# Patient Record
Sex: Male | Born: 1955 | Race: Black or African American | Hispanic: No | Marital: Married | Smoking: Current every day smoker
Health system: Southern US, Community
[De-identification: ages and names within clinical notes are randomized; demographics above are authoritative.]

## PROBLEM LIST (undated history)

## (undated) DIAGNOSIS — E785 Hyperlipidemia, unspecified: Secondary | ICD-10-CM

## (undated) DIAGNOSIS — I10 Essential (primary) hypertension: Secondary | ICD-10-CM

## (undated) DIAGNOSIS — IMO0001 Reserved for inherently not codable concepts without codable children: Secondary | ICD-10-CM

## (undated) DIAGNOSIS — M1990 Unspecified osteoarthritis, unspecified site: Secondary | ICD-10-CM

## (undated) HISTORY — PX: NO PAST SURGERIES: SHX2092

---

## 2015-11-24 ENCOUNTER — Other Ambulatory Visit: Payer: Self-pay | Admitting: Orthopaedic Surgery

## 2015-12-04 ENCOUNTER — Ambulatory Visit (HOSPITAL_COMMUNITY)
Admission: RE | Admit: 2015-12-04 | Discharge: 2015-12-04 | Disposition: A | Discharge status: Home / Self Care | Payer: Medicare Other | Source: Ambulatory Visit | Attending: Orthopaedic Surgery | Admitting: Orthopaedic Surgery

## 2015-12-04 ENCOUNTER — Encounter (HOSPITAL_COMMUNITY): Payer: Self-pay

## 2015-12-04 ENCOUNTER — Encounter (HOSPITAL_COMMUNITY)
Admission: RE | Admit: 2015-12-04 | Discharge: 2015-12-04 | Disposition: A | Discharge status: Home / Self Care | Payer: Medicare Other | Source: Ambulatory Visit | Attending: Orthopaedic Surgery | Admitting: Orthopaedic Surgery

## 2015-12-04 DIAGNOSIS — I10 Essential (primary) hypertension: Secondary | ICD-10-CM | POA: Diagnosis not present

## 2015-12-04 DIAGNOSIS — E785 Hyperlipidemia, unspecified: Secondary | ICD-10-CM | POA: Insufficient documentation

## 2015-12-04 DIAGNOSIS — R9431 Abnormal electrocardiogram [ECG] [EKG]: Secondary | ICD-10-CM | POA: Insufficient documentation

## 2015-12-04 DIAGNOSIS — Z01818 Encounter for other preprocedural examination: Secondary | ICD-10-CM

## 2015-12-04 DIAGNOSIS — Z79899 Other long term (current) drug therapy: Secondary | ICD-10-CM | POA: Diagnosis not present

## 2015-12-04 DIAGNOSIS — F17200 Nicotine dependence, unspecified, uncomplicated: Secondary | ICD-10-CM | POA: Insufficient documentation

## 2015-12-04 DIAGNOSIS — Z01812 Encounter for preprocedural laboratory examination: Secondary | ICD-10-CM | POA: Diagnosis not present

## 2015-12-04 DIAGNOSIS — M1612 Unilateral primary osteoarthritis, left hip: Secondary | ICD-10-CM | POA: Diagnosis not present

## 2015-12-04 DIAGNOSIS — Z0183 Encounter for blood typing: Secondary | ICD-10-CM | POA: Diagnosis not present

## 2015-12-04 HISTORY — DX: Reserved for inherently not codable concepts without codable children: IMO0001

## 2015-12-04 HISTORY — DX: Unspecified osteoarthritis, unspecified site: M19.90

## 2015-12-04 HISTORY — DX: Essential (primary) hypertension: I10

## 2015-12-04 HISTORY — DX: Hyperlipidemia, unspecified: E78.5

## 2015-12-04 LAB — CBC WITH DIFFERENTIAL/PLATELET
BASOS PCT: 1 %
Basophils Absolute: 0.1 10*3/uL (ref 0.0–0.1)
EOS ABS: 0.6 10*3/uL (ref 0.0–0.7)
Eosinophils Relative: 4 %
HEMATOCRIT: 46 % (ref 39.0–52.0)
HEMOGLOBIN: 14.6 g/dL (ref 13.0–17.0)
LYMPHS ABS: 4.9 10*3/uL — AB (ref 0.7–4.0)
LYMPHS PCT: 33 %
MCH: 27.8 pg (ref 26.0–34.0)
MCHC: 31.7 g/dL (ref 30.0–36.0)
MCV: 87.5 fL (ref 78.0–100.0)
MONOS PCT: 5 %
Monocytes Absolute: 0.7 10*3/uL (ref 0.1–1.0)
NEUTROS ABS: 8.4 10*3/uL — AB (ref 1.7–7.7)
Neutrophils Relative %: 57 %
Platelets: 321 10*3/uL (ref 150–400)
RBC: 5.26 MIL/uL (ref 4.22–5.81)
RDW: 14.4 % (ref 11.5–15.5)
WBC: 14.7 10*3/uL — ABNORMAL HIGH (ref 4.0–10.5)

## 2015-12-04 LAB — PROTIME-INR
INR: 1.05 (ref 0.00–1.49)
Prothrombin Time: 13.9 seconds (ref 11.6–15.2)

## 2015-12-04 LAB — ABO/RH: ABO/RH(D): O POS

## 2015-12-04 LAB — BASIC METABOLIC PANEL
ANION GAP: 6 (ref 5–15)
BUN: 17 mg/dL (ref 6–20)
CALCIUM: 9.2 mg/dL (ref 8.9–10.3)
CO2: 24 mmol/L (ref 22–32)
Chloride: 109 mmol/L (ref 101–111)
Creatinine, Ser: 1 mg/dL (ref 0.61–1.24)
GFR calc Af Amer: >60 mL/min (ref >60)
GFR calc non Af Amer: >60 mL/min (ref >60)
GLUCOSE: 96 mg/dL (ref 65–99)
Potassium: 3.9 mmol/L (ref 3.5–5.1)
Sodium: 139 mmol/L (ref 135–145)

## 2015-12-04 LAB — TYPE AND SCREEN
ABO/RH(D): O POS
Antibody Screen: NEGATIVE

## 2015-12-04 LAB — URINALYSIS, ROUTINE W REFLEX MICROSCOPIC
Bilirubin Urine: NEGATIVE
Glucose, UA: NEGATIVE mg/dL
Hgb urine dipstick: NEGATIVE
KETONES UR: NEGATIVE mg/dL
LEUKOCYTES UA: NEGATIVE
NITRITE: NEGATIVE
PROTEIN: NEGATIVE mg/dL
Specific Gravity, Urine: 1.031 — ABNORMAL HIGH (ref 1.005–1.030)
pH: 5.5 (ref 5.0–8.0)

## 2015-12-04 LAB — SURGICAL PCR SCREEN
MRSA, PCR: NEGATIVE
STAPHYLOCOCCUS AUREUS: NEGATIVE

## 2015-12-04 LAB — APTT: aPTT: 35 seconds (ref 24–37)

## 2015-12-04 NOTE — Pre-Procedure Instructions (Signed)
    Randall Dudley  12/04/2015    Your procedure is scheduled on Tuesday, June 13.  Report [facility] at 11:20 A.M.                   Your surgery or procedure is scheduled for 1:20 PM   Call this number if you have problems the morning of surgery: [phone]                 For any other questions, please call [phone], Monday - Friday 8 AM - 4 PM.   Remember:  Do not eat food or drink liquids after midnight Monday, June 12.  Take these medicines the morning of surgery with A SIP OF WATER: lovastatin (MEVACOR),  Metoprolol (Lopressor).               Monday, June 5, STOP taking Ibuprofen (Advil, Motrin).  DO NOT take Naproxen (Aleve), Vitamins, Herbal Products. Aspirin, Aspirin Products.    Do not wear jewelry, make-up or nail polish.  Do not wear lotions, powders, or perfumes.    Men may shave face and neck.  Do not bring valuables to the hospital.  [facility] is not responsible for any belongings or valuables.  Contacts, dentures or bridgework may not be worn into surgery.  Leave your suitcase in the car.  After surgery it may be brought to your room.  For patients admitted to the hospital, discharge time will be determined by your treatment team.  Special instructions:  Review  [place] - Preparing For Surgery.  Please read over the following fact sheets that you were given. [place]- Preparing For Surgery and Patient Instructions for Mupirocin Application, Incentive Spirometry.

## 2015-12-04 NOTE — Progress Notes (Addendum)
Randall Dudley' PCP is Dr Hassie BruceSam Hassan in [place], [place].  Patient does not have a cardiologist.  Randall Dudley denies chest pain, only have shortness of breath with exertion. Randall Dudley reports that he had a stress test many years  "6110, may be 20 years ago."  Patient is unsure of why he had it do. I requested records from Dr Ginette OttoHassan's offive

## 2015-12-04 NOTE — Progress Notes (Signed)
   12/04/15 1031  OBSTRUCTIVE SLEEP APNEA  Have you ever been diagnosed with sleep apnea through a sleep study? No  Do you snore loudly (loud enough to be heard through closed doors)?  0  Do you often feel tired, fatigued, or sleepy during the daytime (such as falling asleep during driving or talking to someone)? 0  Has anyone observed you stop breathing during your sleep? 0  Do you have, or are you being treated for high blood pressure? 1  BMI more than 35 kg/m2? 1  Age > 50 (1-yes) 1  Neck circumference greater than:Male 16 inches or larger, Male 17inches or larger? 1 53(18)  Male Gender (Yes=1) 1  Obstructive Sleep Apnea Score 5  Score 5 or greater  Results sent to PCP

## 2015-12-07 ENCOUNTER — Encounter (HOSPITAL_COMMUNITY): Payer: Self-pay | Admitting: Emergency Medicine

## 2015-12-07 NOTE — Progress Notes (Signed)
Anesthesia Chart Review:  Pt is a 60 year old male scheduled for L total hip arthroplasty on 12/15/2015 with Dr. Jerl Santosalldorf.   PCP is Dr. Quitman LivingsSami Hassan.   PMH includes:  HTN, hyperlipidemia. Current smoker. BMI 35.5  Medications include: lovastatin, metoprolol  Preoperative labs reviewed.    Chest x-ray 12/04/15 reviewed. No active cardiopulmonary disease  EKG 12/04/15: NSR. Incomplete RBBB. LVH. Nonspecific T wave abnormality. Prolonged QT (QTC 467)  If no changes, I anticipate pt can proceed with surgery as scheduled.   Rica Mastngela Khushboo Chuck, FNP-BC [facility]/Anesthesiology Phone: [phone] 12/07/2015 2:50 PM

## 2015-12-15 ENCOUNTER — Inpatient Hospital Stay (HOSPITAL_COMMUNITY): Admission: RE | Admit: 2015-12-15 | Payer: Medicare Other | Source: Ambulatory Visit | Admitting: Orthopaedic Surgery

## 2015-12-15 ENCOUNTER — Encounter (HOSPITAL_COMMUNITY): Admission: RE | Payer: Self-pay | Source: Ambulatory Visit

## 2015-12-15 SURGERY — ARTHROPLASTY, HIP, TOTAL, ANTERIOR APPROACH
Anesthesia: Spinal | Laterality: Left

## 2015-12-24 DIAGNOSIS — D72829 Elevated white blood cell count, unspecified: Secondary | ICD-10-CM | POA: Diagnosis not present

## 2016-02-11 ENCOUNTER — Other Ambulatory Visit: Payer: Self-pay | Admitting: Orthopaedic Surgery

## 2016-03-11 ENCOUNTER — Encounter (HOSPITAL_COMMUNITY): Payer: Self-pay

## 2016-03-11 ENCOUNTER — Encounter (HOSPITAL_COMMUNITY)
Admission: RE | Admit: 2016-03-11 | Discharge: 2016-03-11 | Disposition: A | Discharge status: Home / Self Care | Payer: Medicare Other | Source: Ambulatory Visit | Attending: Orthopaedic Surgery | Admitting: Orthopaedic Surgery

## 2016-03-11 DIAGNOSIS — F17200 Nicotine dependence, unspecified, uncomplicated: Secondary | ICD-10-CM | POA: Insufficient documentation

## 2016-03-11 DIAGNOSIS — I10 Essential (primary) hypertension: Secondary | ICD-10-CM | POA: Diagnosis not present

## 2016-03-11 DIAGNOSIS — D72829 Elevated white blood cell count, unspecified: Secondary | ICD-10-CM | POA: Diagnosis not present

## 2016-03-11 DIAGNOSIS — Z01812 Encounter for preprocedural laboratory examination: Secondary | ICD-10-CM | POA: Insufficient documentation

## 2016-03-11 DIAGNOSIS — E785 Hyperlipidemia, unspecified: Secondary | ICD-10-CM | POA: Insufficient documentation

## 2016-03-11 DIAGNOSIS — Z79899 Other long term (current) drug therapy: Secondary | ICD-10-CM | POA: Insufficient documentation

## 2016-03-11 LAB — URINALYSIS, ROUTINE W REFLEX MICROSCOPIC
Bilirubin Urine: NEGATIVE
Glucose, UA: NEGATIVE mg/dL
Hgb urine dipstick: NEGATIVE
KETONES UR: NEGATIVE mg/dL
LEUKOCYTES UA: NEGATIVE
NITRITE: NEGATIVE
PH: 5.5 (ref 5.0–8.0)
Protein, ur: NEGATIVE mg/dL
SPECIFIC GRAVITY, URINE: 1.029 (ref 1.005–1.030)

## 2016-03-11 LAB — CBC WITH DIFFERENTIAL/PLATELET
Basophils Absolute: 0.1 10*3/uL (ref 0.0–0.1)
Basophils Relative: 1 %
EOS ABS: 0.6 10*3/uL (ref 0.0–0.7)
EOS PCT: 4 %
HCT: 45.3 % (ref 39.0–52.0)
Hemoglobin: 14.5 g/dL (ref 13.0–17.0)
LYMPHS ABS: 5.4 10*3/uL — AB (ref 0.7–4.0)
Lymphocytes Relative: 34 %
MCH: 28.5 pg (ref 26.0–34.0)
MCHC: 32 g/dL (ref 30.0–36.0)
MCV: 89 fL (ref 78.0–100.0)
MONOS PCT: 8 %
Monocytes Absolute: 1.3 10*3/uL — ABNORMAL HIGH (ref 0.1–1.0)
Neutro Abs: 8.5 10*3/uL — ABNORMAL HIGH (ref 1.7–7.7)
Neutrophils Relative %: 53 %
PLATELETS: 316 10*3/uL (ref 150–400)
RBC: 5.09 MIL/uL (ref 4.22–5.81)
RDW: 14.2 % (ref 11.5–15.5)
WBC: 15.9 10*3/uL — ABNORMAL HIGH (ref 4.0–10.5)

## 2016-03-11 LAB — BASIC METABOLIC PANEL
Anion gap: 7 (ref 5–15)
BUN: 13 mg/dL (ref 6–20)
CHLORIDE: 111 mmol/L (ref 101–111)
CO2: 24 mmol/L (ref 22–32)
CREATININE: 0.98 mg/dL (ref 0.61–1.24)
Calcium: 9.6 mg/dL (ref 8.9–10.3)
GFR calc Af Amer: >60 mL/min (ref >60)
GFR calc non Af Amer: >60 mL/min (ref >60)
Glucose, Bld: 92 mg/dL (ref 65–99)
Potassium: 3.8 mmol/L (ref 3.5–5.1)
SODIUM: 142 mmol/L (ref 135–145)

## 2016-03-11 LAB — PROTIME-INR
INR: 0.99
PROTHROMBIN TIME: 13.1 s (ref 11.4–15.2)

## 2016-03-11 LAB — APTT: aPTT: 35 seconds (ref 24–36)

## 2016-03-11 LAB — SURGICAL PCR SCREEN
MRSA, PCR: NEGATIVE
STAPHYLOCOCCUS AUREUS: NEGATIVE

## 2016-03-11 NOTE — Progress Notes (Signed)
PCP - Dr. Quitman LivingsSami Hassan Cardiologist - denies  EKG - 12/04/15 CXR - denies  Echo/cardiac cath - denies Stress test - greater than 10 years ago  Patient denies chest pain and shortness of breath at PAT appointment.  Patient states that his surgery was previously canceled due to elevated blood work and that he was sent to [facility] for additional labs that were normal.  Requested labs and last office visit from PCP (Dr. Roseanne RenoHassan) and [facility].

## 2016-03-11 NOTE — Pre-Procedure Instructions (Signed)
Randall Dudley  03/11/2016      [facility] - [place], [place] - [address] [place] [address] Phone: [phone] Fax: [phone]    Your procedure is scheduled on Tuesday, September 19th, 2017.  [facility] to [facility] Admitting at 5:30 A.M.   Call this number if you have problems the morning of surgery:  [phone]   Remember:  Do not eat food or drink liquids after midnight.   Take these medicines the morning of surgery with A SIP OF WATER: Metoprolol Tartrate (Lopressor), Oxycodone-acetaminophen (Percocet) if needed.  7 days prior to surgery, stop taking: Ibuprofen, Advil, Motrin, Aspirin, NSAIDS, Aleve, Naproxen, BC's, Goody's, Fish oil, all herbal medications, and all vitamins.    Do not wear jewelry.  Do not wear lotions, powders, or colognes, or deoderant.  Men may shave face and neck.  Do not bring valuables to the hospital.  [facility] is not responsible for any belongings or valuables.  Contacts, dentures or bridgework may not be worn into surgery.  Leave your suitcase in the car.  After surgery it may be brought to your room.  For patients admitted to the hospital, discharge time will be determined by your treatment team.  Patients discharged the day of surgery will not be allowed to drive home.   Special instructions:  Preparing for Surgery.   Please read over the following fact sheets that you were given. MRSA Information    [place]- Preparing For Surgery  Before surgery, you can play an important role. Because skin is not sterile, your skin needs to be as free of germs as possible. You can reduce the number of germs on your skin by washing with CHG (chlorahexidine gluconate) Soap before surgery.  CHG is an antiseptic cleaner which kills germs and bonds with the skin to continue killing germs even after washing.  Please do not use if you have an allergy to CHG or antibacterial soaps. If your  skin becomes reddened/irritated stop using the CHG.  Do not shave (including legs and underarms) for at least 48 hours prior to first CHG shower. It is OK to shave your face.  Please follow these instructions carefully.   1. Shower the NIGHT BEFORE SURGERY and the MORNING OF SURGERY with CHG.   2. If you chose to wash your hair, wash your hair first as usual with your normal shampoo.  3. After you shampoo, rinse your hair and body thoroughly to remove the shampoo.  4. Use CHG as you would any other liquid soap. You can apply CHG directly to the skin and wash gently with a scrungie or a clean washcloth.   5. Apply the CHG Soap to your body ONLY FROM THE NECK DOWN.  Do not use on open wounds or open sores. Avoid contact with your eyes, ears, mouth and genitals (private parts). Wash genitals (private parts) with your normal soap.  6. Wash thoroughly, paying special attention to the area where your surgery will be performed.  7. Thoroughly rinse your body with warm water from the neck down.  8. DO NOT shower/wash with your normal soap after using and rinsing off the CHG Soap.  9. Pat yourself dry with a CLEAN TOWEL.   10. Wear CLEAN PAJAMAS   11. Place CLEAN SHEETS on your bed the night of your first shower and DO NOT SLEEP WITH PETS.  Day of Surgery: Do not apply any deodorants/lotions. Please wear  clean clothes to the hospital/surgery center.

## 2016-03-14 NOTE — H&P (Signed)
TOTAL HIP ADMISSION H&P  Patient is admitted for left total hip arthroplasty.  Subjective:  Chief Complaint: left hip pain  HPI: Randall Dudley, 60 y.o. male, has a history of pain and functional disability in the left hip(s) due to arthritis and patient has failed non-surgical conservative treatments for greater than 12 weeks to include NSAID's and/or analgesics, corticosteriod injections, flexibility and strengthening excercises, use of assistive devices, weight reduction as appropriate and activity modification.  Onset of symptoms was gradual starting 5 years ago with gradually worsening course since that time.The patient noted no past surgery on the left hip(s).  Patient currently rates pain in the left hip at 10 out of 10 with activity. Patient has night pain, worsening of pain with activity and weight bearing, trendelenberg gait, pain that interfers with activities of daily living and crepitus. Patient has evidence of subchondral cysts, subchondral sclerosis, periarticular osteophytes and joint space narrowing by imaging studies. This condition presents safety issues increasing the risk of falls. There is no current active infection.  There are no active problems to display for this patient.  Past Medical History:  Diagnosis Date  . Arthritis   . DJD (degenerative joint disease)   . Hyperlipemia   . Hypertension   . Shortness of breath dyspnea    with exertion    Past Surgical History:  Procedure Laterality Date  . NO PAST SURGERIES      No prescriptions prior to admission.   No Known Allergies  Social History  Substance Use Topics  . Smoking status: Current Every Day Smoker    Packs/day: 1.00    Years: 40.00  . Smokeless tobacco: Never Used  . Alcohol use No    No family history on file.   Review of Systems  Musculoskeletal: Positive for joint pain.       Left hip  All other systems reviewed and are negative.   Objective:  Physical Exam  Constitutional: He is  oriented to person, place, and time. He appears well-developed and well-nourished.  HENT:  Head: Normocephalic and atraumatic.  Eyes: Pupils are equal, round, and reactive to light.  Neck: Normal range of motion.  Cardiovascular: Normal rate and regular rhythm.   Respiratory: Effort normal.  GI: Soft.  Musculoskeletal:  Left hip motion is very limited and extremely painful to internal rotation. Opposite hip moves somewhat better. Straight leg raise causes hip and back pain on the left and is negative on the right. He has good lumbosacral motion and no scars on his low back. Abdominal exam is benign except for moderate obesity. Sensation and motor function are intact in his feet with palpable pulses on both sides.   Neurological: He is alert and oriented to person, place, and time.  Skin: Skin is warm and dry.  Psychiatric: He has a normal mood and affect. His behavior is normal. Judgment and thought content normal.    Vital signs in last 24 hours:    Labs:   Estimated body mass index is 33.97 kg/m as calculated from the following:   Height as of 03/11/16: 5\' 9"  (1.753 m).   Weight as of 03/11/16: 104.3 kg (230 lb).   Imaging Review Plain radiographs demonstrate severe degenerative joint disease of the left hip(s). The bone quality appears to be good for age and reported activity level.  Assessment/Plan:  End stage primary arthritis, left hip(s)  The patient history, physical examination, clinical judgement of the provider and imaging studies are consistent with end stage degenerative  joint disease of the left hip(s) and total hip arthroplasty is deemed medically necessary. The treatment options including medical management, injection therapy, arthroscopy and arthroplasty were discussed at length. The risks and benefits of total hip arthroplasty were presented and reviewed. The risks due to aseptic loosening, infection, stiffness, dislocation/subluxation,  thromboembolic complications  and other imponderables were discussed.  The patient acknowledged the explanation, agreed to proceed with the plan and consent was signed. Patient is being admitted for inpatient treatment for surgery, pain control, PT, OT, prophylactic antibiotics, VTE prophylaxis, progressive ambulation and ADL's and discharge planning.The patient is planning to be discharged home with home health services

## 2016-03-15 NOTE — Progress Notes (Signed)
Anesthesia Chart Review:  Pt is a 60 year old male scheduled for L total hip arthroplasty anterior approach on 03/22/2016 with Marcene CorningPeter Dalldorf, MD.   PMH includes:  HTN, hyperlipidemia. Current smoker. BMI 34  Medications include: lovastatin, metoprolol  Preoperative labs reviewed.  WBC 15.9. Prior results range 14.7-15.1 (some on paper chart)  CXR 12/04/15: No active cardiopulmonary disease  EKG 12/04/15: NSR. Incomplete RBBB. LVH. Nonspecific T wave abnormality. Prolonged QT  Pt was originally scheduled for this surgery 12/15/15 but it was cancelled for leukocytosis. Pt then saw Rennis HardingeQuincy Lewis, MD at [facility] on 12/24/15 for this problem. Dr. Melvyn NethLewis had seen pt for same problem 2 years ago and felt it was unchanged; mild leukocytosis thought to be due from smoking and pt was cleared for surgery from hematological standpoint.   If no changes, I anticipate pt can proceed with surgery as scheduled.   Rica Mastngela Ajia Chadderdon, FNP-BC [facility]/Anesthesiology Phone: [phone] 03/15/2016 3:55 PM

## 2016-03-21 NOTE — Anesthesia Preprocedure Evaluation (Addendum)
Anesthesia Evaluation  Patient identified by MRN, date of birth, ID band Patient awake    Reviewed: Allergy & Precautions, H&P , NPO status , Patient's Chart, lab work & pertinent test results, reviewed documented beta blocker date and time   Airway Mallampati: III  TM Distance: >3 FB Neck ROM: Full    Dental no notable dental hx. (+) Edentulous Upper, Edentulous Lower, Dental Advisory Given   Pulmonary shortness of breath and with exertion, Current Smoker,    Pulmonary exam normal breath sounds clear to auscultation       Cardiovascular hypertension, Pt. on medications and Pt. on home beta blockers  Rhythm:Regular Rate:Normal     Neuro/Psych negative neurological ROS  negative psych ROS   GI/Hepatic negative GI ROS, Neg liver ROS,   Endo/Other  negative endocrine ROS  Renal/GU negative Renal ROS  negative genitourinary   Musculoskeletal  (+) Arthritis , Osteoarthritis,    Abdominal   Peds  Hematology negative hematology ROS (+)   Anesthesia Other Findings   Reproductive/Obstetrics negative OB ROS                            Anesthesia Physical Anesthesia Plan  ASA: II  Anesthesia Plan: MAC and Spinal   Post-op Pain Management:    Induction: Intravenous  Airway Management Planned: Simple Face Mask  Additional Equipment:   Intra-op Plan:   Post-operative Plan:   Informed Consent: I have reviewed the patients History and Physical, chart, labs and discussed the procedure including the risks, benefits and alternatives for the proposed anesthesia with the patient or authorized representative who has indicated his/her understanding and acceptance.   Dental advisory given  Plan Discussed with: CRNA  Anesthesia Plan Comments:         Anesthesia Quick Evaluation

## 2016-03-22 ENCOUNTER — Inpatient Hospital Stay (HOSPITAL_COMMUNITY): Payer: Medicare Other | Admitting: Emergency Medicine

## 2016-03-22 ENCOUNTER — Inpatient Hospital Stay (HOSPITAL_COMMUNITY): Payer: Medicare Other | Admitting: Anesthesiology

## 2016-03-22 ENCOUNTER — Inpatient Hospital Stay (HOSPITAL_COMMUNITY): Payer: Medicare Other

## 2016-03-22 ENCOUNTER — Inpatient Hospital Stay (HOSPITAL_COMMUNITY)
Admission: RE | Admit: 2016-03-22 | Discharge: 2016-03-24 | DRG: 470 | Disposition: A | Discharge status: Home Health | Payer: Medicare Other | Source: Ambulatory Visit | Attending: Orthopaedic Surgery | Admitting: Orthopaedic Surgery

## 2016-03-22 ENCOUNTER — Encounter (HOSPITAL_COMMUNITY): Payer: Self-pay | Admitting: General Practice

## 2016-03-22 ENCOUNTER — Encounter (HOSPITAL_COMMUNITY)
Admission: RE | Disposition: A | Discharge status: Home Health | Payer: Self-pay | Source: Ambulatory Visit | Attending: Orthopaedic Surgery

## 2016-03-22 DIAGNOSIS — F17200 Nicotine dependence, unspecified, uncomplicated: Secondary | ICD-10-CM | POA: Diagnosis present

## 2016-03-22 DIAGNOSIS — Z7409 Other reduced mobility: Secondary | ICD-10-CM

## 2016-03-22 DIAGNOSIS — M25552 Pain in left hip: Secondary | ICD-10-CM

## 2016-03-22 DIAGNOSIS — M1612 Unilateral primary osteoarthritis, left hip: Principal | ICD-10-CM | POA: Diagnosis present

## 2016-03-22 DIAGNOSIS — I10 Essential (primary) hypertension: Secondary | ICD-10-CM | POA: Diagnosis present

## 2016-03-22 DIAGNOSIS — Z419 Encounter for procedure for purposes other than remedying health state, unspecified: Secondary | ICD-10-CM

## 2016-03-22 DIAGNOSIS — E785 Hyperlipidemia, unspecified: Secondary | ICD-10-CM | POA: Diagnosis present

## 2016-03-22 HISTORY — PX: TOTAL HIP ARTHROPLASTY: SHX124

## 2016-03-22 SURGERY — ARTHROPLASTY, HIP, TOTAL, ANTERIOR APPROACH
Anesthesia: Monitor Anesthesia Care | Site: Hip | Laterality: Left

## 2016-03-22 MED ORDER — METOPROLOL TARTRATE 25 MG PO TABS
25.0000 mg | ORAL_TABLET | Freq: Two times a day (BID) | ORAL | Status: DC
  Administered 2016-03-22 – 2016-03-24 (×4): 25 mg via ORAL
  Filled 2016-03-22 (×4): qty 1

## 2016-03-22 MED ORDER — METHOCARBAMOL 1000 MG/10ML IJ SOLN
500.0000 mg | Freq: Four times a day (QID) | INTRAVENOUS | Status: DC | PRN
  Filled 2016-03-22: qty 5

## 2016-03-22 MED ORDER — FENTANYL CITRATE (PF) 100 MCG/2ML IJ SOLN
INTRAMUSCULAR | Status: DC | PRN
  Administered 2016-03-22 (×2): 25 ug via INTRAVENOUS
  Administered 2016-03-22: 50 ug via INTRAVENOUS

## 2016-03-22 MED ORDER — BISACODYL 5 MG PO TBEC: 5.0000 mg | DELAYED_RELEASE_TABLET | Freq: Every day | ORAL | Status: DC | PRN

## 2016-03-22 MED ORDER — TRANEXAMIC ACID 1000 MG/10ML IV SOLN
1000.0000 mg | INTRAVENOUS | Status: AC
  Administered 2016-03-22: 1000 mg via INTRAVENOUS
  Filled 2016-03-22: qty 10

## 2016-03-22 MED ORDER — BUPIVACAINE IN DEXTROSE 0.75-8.25 % IT SOLN
INTRATHECAL | Status: DC | PRN
  Administered 2016-03-22: 15 mg via INTRATHECAL

## 2016-03-22 MED ORDER — HYDROMORPHONE HCL 1 MG/ML IJ SOLN
0.5000 mg | INTRAMUSCULAR | Status: DC | PRN
  Administered 2016-03-22 – 2016-03-23 (×4): 1 mg via INTRAVENOUS
  Filled 2016-03-22 (×3): qty 1

## 2016-03-22 MED ORDER — MENTHOL 3 MG MT LOZG: 1.0000 | LOZENGE | OROMUCOSAL | Status: DC | PRN

## 2016-03-22 MED ORDER — PHENYLEPHRINE HCL 10 MG/ML IJ SOLN
INTRAMUSCULAR | Status: DC | PRN
  Administered 2016-03-22: 80 ug via INTRAVENOUS

## 2016-03-22 MED ORDER — MIDAZOLAM HCL 2 MG/2ML IJ SOLN
INTRAMUSCULAR | Status: AC
  Filled 2016-03-22: qty 2

## 2016-03-22 MED ORDER — METHOCARBAMOL 500 MG PO TABS
ORAL_TABLET | ORAL | Status: AC
  Filled 2016-03-22: qty 1

## 2016-03-22 MED ORDER — METHOCARBAMOL 500 MG PO TABS
500.0000 mg | ORAL_TABLET | Freq: Four times a day (QID) | ORAL | Status: DC | PRN
  Administered 2016-03-22 – 2016-03-24 (×7): 500 mg via ORAL
  Filled 2016-03-22 (×7): qty 1

## 2016-03-22 MED ORDER — MIDAZOLAM HCL 5 MG/5ML IJ SOLN
INTRAMUSCULAR | Status: DC | PRN
  Administered 2016-03-22: 2 mg via INTRAVENOUS

## 2016-03-22 MED ORDER — METOCLOPRAMIDE HCL 5 MG PO TABS: 5.0000 mg | ORAL_TABLET | Freq: Three times a day (TID) | ORAL | Status: DC | PRN

## 2016-03-22 MED ORDER — LACTATED RINGERS IV SOLN
INTRAVENOUS | Status: DC
  Administered 2016-03-22: 15:00:00 via INTRAVENOUS

## 2016-03-22 MED ORDER — ONDANSETRON HCL 4 MG PO TABS: 4.0000 mg | ORAL_TABLET | Freq: Four times a day (QID) | ORAL | Status: DC | PRN

## 2016-03-22 MED ORDER — CEFAZOLIN SODIUM-DEXTROSE 2-4 GM/100ML-% IV SOLN
2.0000 g | Freq: Four times a day (QID) | INTRAVENOUS | Status: AC
  Administered 2016-03-22 (×2): 2 g via INTRAVENOUS
  Filled 2016-03-22 (×3): qty 100

## 2016-03-22 MED ORDER — PROPOFOL 500 MG/50ML IV EMUL
INTRAVENOUS | Status: DC | PRN
  Administered 2016-03-22: 100 ug/kg/min via INTRAVENOUS

## 2016-03-22 MED ORDER — LACTATED RINGERS IV SOLN
INTRAVENOUS | Status: DC | PRN
  Administered 2016-03-22 (×2): via INTRAVENOUS

## 2016-03-22 MED ORDER — DIPHENHYDRAMINE HCL 12.5 MG/5ML PO ELIX
12.5000 mg | ORAL_SOLUTION | ORAL | Status: DC | PRN
Start: 2016-03-22 — End: 2016-03-24

## 2016-03-22 MED ORDER — ALUM & MAG HYDROXIDE-SIMETH 200-200-20 MG/5ML PO SUSP: 30.0000 mL | ORAL | Status: DC | PRN

## 2016-03-22 MED ORDER — DOCUSATE SODIUM 100 MG PO CAPS
100.0000 mg | ORAL_CAPSULE | Freq: Two times a day (BID) | ORAL | Status: DC
  Administered 2016-03-22 – 2016-03-24 (×4): 100 mg via ORAL
  Filled 2016-03-22 (×4): qty 1

## 2016-03-22 MED ORDER — METOCLOPRAMIDE HCL 5 MG/ML IJ SOLN: 5.0000 mg | Freq: Three times a day (TID) | INTRAMUSCULAR | Status: DC | PRN

## 2016-03-22 MED ORDER — CEFAZOLIN SODIUM-DEXTROSE 2-4 GM/100ML-% IV SOLN
2.0000 g | INTRAVENOUS | Status: AC
  Administered 2016-03-22: 2 g via INTRAVENOUS
  Filled 2016-03-22: qty 100

## 2016-03-22 MED ORDER — OXYCODONE HCL 5 MG PO TABS
5.0000 mg | ORAL_TABLET | ORAL | Status: DC | PRN
  Administered 2016-03-22 – 2016-03-24 (×10): 10 mg via ORAL
  Filled 2016-03-22 (×9): qty 2

## 2016-03-22 MED ORDER — BUPIVACAINE LIPOSOME 1.3 % IJ SUSP
INTRAMUSCULAR | Status: DC | PRN
  Administered 2016-03-22: 20 mL

## 2016-03-22 MED ORDER — FENTANYL CITRATE (PF) 100 MCG/2ML IJ SOLN
INTRAMUSCULAR | Status: AC
  Filled 2016-03-22: qty 2

## 2016-03-22 MED ORDER — 0.9 % SODIUM CHLORIDE (POUR BTL) OPTIME
TOPICAL | Status: DC | PRN
  Administered 2016-03-22: 1000 mL

## 2016-03-22 MED ORDER — PHENOL 1.4 % MT LIQD: 1.0000 | OROMUCOSAL | Status: DC | PRN

## 2016-03-22 MED ORDER — BUPIVACAINE-EPINEPHRINE (PF) 0.25% -1:200000 IJ SOLN
INTRAMUSCULAR | Status: AC
  Filled 2016-03-22: qty 30

## 2016-03-22 MED ORDER — BUPIVACAINE LIPOSOME 1.3 % IJ SUSP
20.0000 mL | Freq: Once | INTRAMUSCULAR | Status: DC
  Filled 2016-03-22: qty 20

## 2016-03-22 MED ORDER — ACETAMINOPHEN 650 MG RE SUPP: 650.0000 mg | Freq: Four times a day (QID) | RECTAL | Status: DC | PRN

## 2016-03-22 MED ORDER — ACETAMINOPHEN 325 MG PO TABS
650.0000 mg | ORAL_TABLET | Freq: Four times a day (QID) | ORAL | Status: DC | PRN
Start: 2016-03-22 — End: 2016-03-24
  Administered 2016-03-23 (×2): 650 mg via ORAL
  Filled 2016-03-22 (×3): qty 2

## 2016-03-22 MED ORDER — HYDROMORPHONE HCL 1 MG/ML IJ SOLN
INTRAMUSCULAR | Status: AC
  Filled 2016-03-22: qty 1

## 2016-03-22 MED ORDER — PRAVASTATIN SODIUM 20 MG PO TABS
20.0000 mg | ORAL_TABLET | Freq: Every day | ORAL | Status: DC
  Administered 2016-03-22 – 2016-03-23 (×2): 20 mg via ORAL
  Filled 2016-03-22 (×2): qty 1

## 2016-03-22 MED ORDER — PROPOFOL 10 MG/ML IV BOLUS
INTRAVENOUS | Status: AC
  Filled 2016-03-22: qty 20

## 2016-03-22 MED ORDER — TRANEXAMIC ACID 1000 MG/10ML IV SOLN
2000.0000 mg | Freq: Once | INTRAVENOUS | Status: DC
  Filled 2016-03-22 (×2): qty 20

## 2016-03-22 MED ORDER — TRANEXAMIC ACID 1000 MG/10ML IV SOLN
1000.0000 mg | Freq: Once | INTRAVENOUS | Status: AC
  Administered 2016-03-22: 1000 mg via INTRAVENOUS
  Filled 2016-03-22: qty 10

## 2016-03-22 MED ORDER — ASPIRIN EC 325 MG PO TBEC
325.0000 mg | DELAYED_RELEASE_TABLET | Freq: Two times a day (BID) | ORAL | Status: DC
  Administered 2016-03-23 – 2016-03-24 (×3): 325 mg via ORAL
  Filled 2016-03-22 (×3): qty 1

## 2016-03-22 MED ORDER — BUPIVACAINE-EPINEPHRINE 0.25% -1:200000 IJ SOLN
INTRAMUSCULAR | Status: DC | PRN
  Administered 2016-03-22: 20 mL

## 2016-03-22 MED ORDER — ONDANSETRON HCL 4 MG/2ML IJ SOLN: 4.0000 mg | Freq: Four times a day (QID) | INTRAMUSCULAR | Status: DC | PRN

## 2016-03-22 MED ORDER — LACTATED RINGERS IV SOLN: INTRAVENOUS | Status: DC

## 2016-03-22 MED ORDER — OXYCODONE HCL 5 MG PO TABS
ORAL_TABLET | ORAL | Status: AC
  Filled 2016-03-22: qty 2

## 2016-03-22 MED ORDER — CHLORHEXIDINE GLUCONATE 4 % EX LIQD: 60.0000 mL | Freq: Once | CUTANEOUS | Status: DC

## 2016-03-22 MED ORDER — HYDROMORPHONE HCL 1 MG/ML IJ SOLN
0.2500 mg | INTRAMUSCULAR | Status: DC | PRN
  Administered 2016-03-22 (×4): 0.5 mg via INTRAVENOUS

## 2016-03-22 SURGICAL SUPPLY — 51 items
BLADE SAW SGTL 18X1.27X75 (BLADE) ×2 IMPLANT
BLADE SAW SGTL 18X1.27X75MM (BLADE) ×1
BLADE SURG ROTATE 9660 (MISCELLANEOUS) IMPLANT
CAPT HIP TOTAL 2 ×3 IMPLANT
CELLS DAT CNTRL 66122 CELL SVR (MISCELLANEOUS) ×1 IMPLANT
CLOSURE STERI-STRIP 1/2X4 (GAUZE/BANDAGES/DRESSINGS) ×1
CLSR STERI-STRIP ANTIMIC 1/2X4 (GAUZE/BANDAGES/DRESSINGS) ×2 IMPLANT
COVER PERINEAL POST (MISCELLANEOUS) ×3 IMPLANT
COVER SURGICAL LIGHT HANDLE (MISCELLANEOUS) ×3 IMPLANT
DRAPE C-ARM 42X72 X-RAY (DRAPES) ×3 IMPLANT
DRAPE IMP U-DRAPE 54X76 (DRAPES) ×3 IMPLANT
DRAPE STERI IOBAN 125X83 (DRAPES) ×3 IMPLANT
DRAPE U-SHAPE 47X51 STRL (DRAPES) ×9 IMPLANT
DRSG AQUACEL AG ADV 3.5X 6 (GAUZE/BANDAGES/DRESSINGS) ×3 IMPLANT
DRSG AQUACEL AG ADV 3.5X10 (GAUZE/BANDAGES/DRESSINGS) ×3 IMPLANT
DURAPREP 26ML APPLICATOR (WOUND CARE) ×3 IMPLANT
ELECT BLADE 4.0 EZ CLEAN MEGAD (MISCELLANEOUS)
ELECT CAUTERY BLADE 6.4 (BLADE) ×3 IMPLANT
ELECT REM PT RETURN 9FT ADLT (ELECTROSURGICAL) ×3
ELECTRODE BLDE 4.0 EZ CLN MEGD (MISCELLANEOUS) IMPLANT
ELECTRODE REM PT RTRN 9FT ADLT (ELECTROSURGICAL) ×1 IMPLANT
FACESHIELD WRAPAROUND (MASK) ×6 IMPLANT
GLOVE BIO SURGEON STRL SZ8 (GLOVE) ×6 IMPLANT
GLOVE BIOGEL PI IND STRL 8 (GLOVE) ×2 IMPLANT
GLOVE BIOGEL PI INDICATOR 8 (GLOVE) ×4
GOWN STRL REUS W/ TWL LRG LVL3 (GOWN DISPOSABLE) ×1 IMPLANT
GOWN STRL REUS W/ TWL XL LVL3 (GOWN DISPOSABLE) ×2 IMPLANT
GOWN STRL REUS W/TWL LRG LVL3 (GOWN DISPOSABLE) ×2
GOWN STRL REUS W/TWL XL LVL3 (GOWN DISPOSABLE) ×4
KIT BASIN OR (CUSTOM PROCEDURE TRAY) ×3 IMPLANT
KIT ROOM TURNOVER OR (KITS) ×3 IMPLANT
MANIFOLD NEPTUNE II (INSTRUMENTS) ×3 IMPLANT
NEEDLE HYPO 22GX1.5 SAFETY (NEEDLE) ×3 IMPLANT
NS IRRIG 1000ML POUR BTL (IV SOLUTION) ×3 IMPLANT
PACK TOTAL JOINT (CUSTOM PROCEDURE TRAY) ×3 IMPLANT
PAD ARMBOARD 7.5X6 YLW CONV (MISCELLANEOUS) ×6 IMPLANT
RTRCTR WOUND ALEXIS 18CM MED (MISCELLANEOUS) ×3
STAPLER VISISTAT 35W (STAPLE) ×3 IMPLANT
SUT ETHIBOND NAB CT1 #1 30IN (SUTURE) ×12 IMPLANT
SUT VIC AB 0 CT1 27 (SUTURE) ×2
SUT VIC AB 0 CT1 27XBRD ANBCTR (SUTURE) ×1 IMPLANT
SUT VIC AB 1 CT1 27 (SUTURE) ×2
SUT VIC AB 1 CT1 27XBRD ANBCTR (SUTURE) ×1 IMPLANT
SUT VIC AB 2-0 CT1 27 (SUTURE) ×4
SUT VIC AB 2-0 CT1 TAPERPNT 27 (SUTURE) ×2 IMPLANT
SUT VLOC 180 0 24IN GS25 (SUTURE) ×6 IMPLANT
SYR 50ML LL SCALE MARK (SYRINGE) ×3 IMPLANT
TOWEL OR 17X24 6PK STRL BLUE (TOWEL DISPOSABLE) ×3 IMPLANT
TOWEL OR 17X26 10 PK STRL BLUE (TOWEL DISPOSABLE) ×6 IMPLANT
TRAY FOLEY CATH 14FR (SET/KITS/TRAYS/PACK) IMPLANT
WATER STERILE IRR 1000ML POUR (IV SOLUTION) ×6 IMPLANT

## 2016-03-22 NOTE — Anesthesia Procedure Notes (Signed)
Spinal  Patient location during procedure: OR Start time: 03/22/2016 7:34 AM End time: 03/22/2016 7:39 AM Staffing Anesthesiologist: Gaynelle AduFITZGERALD, Revan Gendron Performed: anesthesiologist  Preanesthetic Checklist Completed: patient identified, surgical consent, pre-op evaluation, timeout performed, IV checked, risks and benefits discussed and monitors and equipment checked Spinal Block Patient position: sitting Prep: DuraPrep Patient monitoring: cardiac monitor, continuous pulse ox and blood pressure Approach: midline Location: L3-4 Injection technique: single-shot Needle Needle type: Pencan  Needle gauge: 24 G Needle length: 9 cm Assessment Sensory level: T8 Additional Notes Functioning IV was confirmed and monitors were applied. Sterile prep and drape, including hand hygiene and sterile gloves were used. The patient was positioned and the spine was prepped. The skin was anesthetized with lidocaine.  Free flow of clear CSF was obtained prior to injecting local anesthetic into the CSF.  The spinal needle aspirated freely following injection.  The needle was carefully withdrawn.  The patient tolerated the procedure well.

## 2016-03-22 NOTE — Interval H&P Note (Signed)
History and Physical Interval Note:  03/22/2016 7:17 AM  Randall Dudley  has presented today for surgery, with the diagnosis of LEFT HIP DEGENERATIVE JOINT DISEASE  The various methods of treatment have been discussed with the patient and family. After consideration of risks, benefits and other options for treatment, the patient has consented to  Procedure(s): TOTAL HIP ARTHROPLASTY ANTERIOR APPROACH (Left) as a surgical intervention .  The patient's history has been reviewed, patient examined, no change in status, stable for surgery.  I have reviewed the patient's chart and labs.  Questions were answered to the patient's satisfaction.     Bayyinah Dukeman G

## 2016-03-22 NOTE — Anesthesia Postprocedure Evaluation (Signed)
Anesthesia Post Note  Patient: Randall Dudley  Procedure(s) Performed: Procedure(s) (LRB): TOTAL HIP ARTHROPLASTY ANTERIOR APPROACH (Left)  Patient location during evaluation: PACU Anesthesia Type: Spinal and MAC Level of consciousness: awake and alert Pain management: pain level controlled Vital Signs Assessment: post-procedure vital signs reviewed and stable Respiratory status: spontaneous breathing and respiratory function stable Cardiovascular status: blood pressure returned to baseline and stable Postop Assessment: spinal receding Anesthetic complications: no    Last Vitals:  Vitals:   03/22/16 1142 03/22/16 1157  BP: 112/71 96/62  Pulse: 67 64  Resp: 16 18  Temp:      Last Pain:  Vitals:   03/22/16 1205  TempSrc:   PainSc: 6                  Acadia Thammavong,W. EDMOND

## 2016-03-22 NOTE — Anesthesia Procedure Notes (Signed)
Procedure Name: MAC Date/Time: 03/22/2016 7:45 AM Performed by: Sheppard EvensMANESS, Tahnee Cifuentes B Pre-anesthesia Checklist: Patient identified, Emergency Drugs available, Suction available, Patient being monitored and Timeout performed Patient Re-evaluated:Patient Re-evaluated prior to inductionOxygen Delivery Method: Nasal cannula

## 2016-03-22 NOTE — Op Note (Signed)
PRE-OP DIAGNOSIS:  LEFT HIP DEGENERATIVE JOINT DISEASE POST-OP DIAGNOSIS: same PROCEDURE:  LEFT TOTAL HIP ARTHROPLASTY ANTERIOR APPROACH ANESTHESIA:  Spinal and MAC SURGEON:  Marcene CorningPeter Markelle Asaro MD ASSISTANT:  Elodia FlorenceAndrew Nida PA-C   INDICATIONS FOR PROCEDURE:  The patient is a 60 y.o. male with a long history of a painful hip.  This has persisted despite multiple conservative measures.  The patient has persisted with pain and dysfunction making rest and activity difficult.  A total hip replacement is offered as surgical treatment.  Informed operative consent was obtained after discussion of possible complications including reaction to anesthesia, infection, neurovascular injury, dislocation, DVT, PE, and death.  The importance of the postoperative rehab program to optimize result was stressed with the patient.  SUMMARY OF FINDINGS AND PROCEDURE:  Under general anesthesia through a anterior approach an the Hana table a left THR was performed.  The patient had severe degenerative change and excellent bone quality.  We used DePuy components to replace the hip and these were size KA12 Corail femur capped with a +1.5 36mm ceramic hip ball.  On the acetabular side we used a size 54 Gription shell with a plus 4 neutral polyethylene liner.  We did use a hole eliminator.  Elodia FlorenceAndrew Nida PA-C assisted throughout and was invaluable to the completion of the case in that he helped position and retract while I performed the procedure.  He also closed simultaneously to help minimize OR time.  I used fluoroscopy throughout the case to check position of components and leg lengths and read all these views myself.  DESCRIPTION OF PROCEDURE:  The patient was taken to the OR suite where general anesthetic was applied.  The patient was then positioned on the Hana table supine.  All bony prominences were appropriately padded.  Prep and drape was then performed in normal sterile fashion.  The patient was given kefzol preoperative  antibiotic and an appropriate time out was performed.  We then took an anterior approach to the left hip.  Dissection was taken through adipose to the tensor fascia lata fascia.  This structure was incised longitudinally and we dissected in the intermuscular interval just medial to this muscle.  Cobra retractors were placed superior and inferior to the femoral neck superficial to the capsule.  A capsular incision was then made and the retractors were placed along the femoral neck.  Xray was brought in to get a good level for the femoral neck cut which was made with an oscillating saw and osteotome.  The femoral head was removed with a corkscrew.  The acetabulum was exposed and some labral tissues were excised. Reaming was taken to the inside wall of the pelvis and sequentially up to 1 mm smaller than the actual component.  A trial of components was done and then the aforementioned acetabular shell was placed in appropriate tilt and anteversion confirmed by fluoroscopy. The liner was placed along with the hole eliminator and attention was turned to the femur.  The leg was brought down and over into adduction and the elevator bar was used to raise the femur up gently in the wound.  The piriformis was released with care taken to preserve the obturator internus attachment and all of the posterior capsule. The femur was reamed and then broached to the appropriate size.  A trial reduction was done and the aforementioned head and neck assembly gave us the best stability in extension with external rotation.  Leg lengths were felt to be about equal by fluoroscopic exam.  The trial components were removed and the wound irrigated.  We then placed the femoral component in appropriate anteversion.  The head was applied to a dry stem neck and the hip again reduced.  It was again stable in the aforementioned position.  The would was irrigated again followed by re-approximation of anterior capsule with ethibond suture. Tensor  fascia was repaired with V-loc suture  followed by deep closure with #O and #2 undyed vicryl.  Skin was closed with subQ stitch and steristrips followed by a sterile dressing.  EBL and IOF can be obtained from anesthesia records.  DISPOSITION:  The patient was extubated in the OR and taken to PACU in stable condition to be admitted to the Orthopedic Surgery for appropriate post-op care to include perioperative antibiotics and DVT prophylaxis.

## 2016-03-22 NOTE — Transfer of Care (Signed)
Immediate Anesthesia Transfer of Care Note  Patient: Randall Dudley  Procedure(s) Performed: Procedure(s): TOTAL HIP ARTHROPLASTY ANTERIOR APPROACH (Left)  Patient Location: PACU  Anesthesia Type:MAC and Spinal  Level of Consciousness: awake, alert  and oriented  Airway & Oxygen Therapy: Patient Spontanous Breathing  Post-op Assessment: Report given to RN and Post -op Vital signs reviewed and stable  Post vital signs: Reviewed and stable  Last Vitals:  Vitals:   03/22/16 0635  BP: (!) 150/98  Pulse: 75  Resp: 18  Temp: 36.9 C    Last Pain:  Vitals:   03/22/16 0635  TempSrc: Oral  PainSc: 8       Patients Stated Pain Goal: 3 (03/22/16 [id number])  Complications: No apparent anesthesia complications

## 2016-03-23 ENCOUNTER — Encounter (HOSPITAL_COMMUNITY): Payer: Self-pay | Admitting: Orthopaedic Surgery

## 2016-03-23 NOTE — Evaluation (Signed)
Physical Therapy Evaluation Patient Details Name: Randall Dudley MRN: [id number] DOB: 12/21/1955 Today's Date: 03/23/2016   History of Present Illness  Patient is a 60 y/o male with hx of HTN and HLD presents s/p left THA, direct anterior approach.  Clinical Impression  Patient presents with pain and post surgical deficits s/p above surgery. Tolerated gait training and transfers with Min A for balance/safety. Pain is biggest limiting factor. Pt independent PTA. Pt will not have 24/7 supervision at home at d/c so needs to be Mod I with RW. Education re: exercises, positioning, elevation etc. Will follow acutely to maximize independence and mobility prior to return home.     Follow Up Recommendations Home health PT;Supervision for mobility/OOB    Equipment Recommendations  None recommended by PT    Recommendations for Other Services OT consult     Precautions / Restrictions Precautions Precautions: Fall Restrictions Weight Bearing Restrictions: Yes LLE Weight Bearing: Weight bearing as tolerated      Mobility  Bed Mobility Overal bed mobility: Needs Assistance Bed Mobility: Supine to Sit     Supine to sit: Mod assist;HOB elevated     General bed mobility comments: Assist to bring LLE to EOB and elevate trunk. Pt with right lateral lean to offload left hip due to pain.  Transfers Overall transfer level: Needs assistance Equipment used: Rolling walker (2 wheeled) Transfers: Sit to/from Stand Sit to Stand: From elevated surface         General transfer comment: Min A to power to standing with cues for hand placement/technique.  Ambulation/Gait Ambulation/Gait assistance: Min assist Ambulation Distance (Feet): 30 Feet Assistive device: Rolling walker (2 wheeled) Gait Pattern/deviations: Step-to pattern;Decreased stance time - left;Decreased step length - right;Trunk flexed;Antalgic Gait velocity: decreased Gait velocity interpretation: Below normal speed for  age/gender General Gait Details: Slow, unsteady gait with cues for step through gait and knee extension during stance. Multiple standing rest breaks due to fatigue/pain. SP02 90% on RA.   Stairs            Wheelchair Mobility    Modified Rankin (Stroke Patients Only)       Balance Overall balance assessment: Needs assistance Sitting-balance support: Feet supported;No upper extremity supported Sitting balance-Leahy Scale: Fair Sitting balance - Comments: UE support and right lateral leant offload left hip Postural control: Right lateral lean Standing balance support: During functional activity;Single extremity supported Standing balance-Leahy Scale: Fair Standing balance comment: Able to stand with 1 UE support statically whena djusting RW.                             Pertinent Vitals/Pain Pain Assessment: 0-10 Pain Score: 9  Pain Location: left hip Pain Descriptors / Indicators: Sore;Operative site guarding;Guarding Pain Intervention(s): Monitored during session;Repositioned;Limited activity within patient's tolerance;Ice applied    Home Living Family/patient expects to be discharged to:: Private residence Living Arrangements: Spouse/significant other Available Help at Discharge: Family;Available PRN/intermittently Type of Home: House Home Access: Stairs to enter Entrance Stairs-Rails: None Entrance Stairs-Number of Steps: 1 Home Layout: One level Home Equipment: Walker - 2 wheels;Cane - single point;Bedside commode      Prior Function Level of Independence: Independent               Hand Dominance        Extremity/Trunk Assessment   Upper Extremity Assessment: Defer to OT evaluation           Lower Extremity Assessment: LLE deficits/detail  LLE Deficits / Details: Limitd AROM/strength secondary to pain/surgery     Communication   Communication: No difficulties  Cognition Arousal/Alertness: Awake/alert Behavior During Therapy:  WFL for tasks assessed/performed Overall Cognitive Status: Within Functional Limits for tasks assessed                      General Comments General comments (skin integrity, edema, etc.): Wife present during session. Wife will not be able to provide 24/7 S.    Exercises Total Joint Exercises Ankle Circles/Pumps: Both;10 reps;Supine Quad Sets: Both;10 reps;Supine Gluteal Sets: Both;10 reps;Supine   Assessment/Plan    PT Assessment Patient needs continued PT services  PT Problem List Decreased strength;Decreased mobility;Decreased range of motion;Cardiopulmonary status limiting activity;Decreased activity tolerance;Decreased balance;Pain;Decreased knowledge of use of DME          PT Treatment Interventions DME instruction;Therapeutic activities;Gait training;Therapeutic exercise;Patient/family education;Balance training;Functional mobility training;Stair training    PT Goals (Current goals can be found in the Care Plan section)  Acute Rehab PT Goals Patient Stated Goal: to get back to mowing my lawn PT Goal Formulation: With patient/family Time For Goal Achievement: 04/06/16 Potential to Achieve Goals: Good    Frequency 7X/week   Barriers to discharge Decreased caregiver support wife cannot provide 24/7.    Co-evaluation               End of Session Equipment Utilized During Treatment: Gait belt Activity Tolerance: Patient tolerated treatment well;Patient limited by pain Patient left: in chair;with call bell/phone within reach;with family/visitor present Nurse Communication: Mobility status         Time: 1610-9604 PT Time Calculation (min) (ACUTE ONLY): 23 min   Charges:   PT Evaluation $PT Eval Moderate Complexity: 1 Procedure PT Treatments $Gait Training: 8-22 mins   PT G Codes:        Dametri Ozburn A Santrice Muzio 03/23/2016, 9:55 AM Mylo Red, PT, DPT [phone]

## 2016-03-23 NOTE — Evaluation (Signed)
Occupational Therapy Evaluation Patient Details Name: Randall Dudley MRN: [id number] DOB: 10/23/1955 Today's Date: 03/23/2016    History of Present Illness Patient is a 60 y/o male with hx of HTN and HLD presents s/p left THA, direct anterior approach.   Clinical Impression   Pt was independent prior to admission. Presents with L hip pain and impaired balance interfering with ability to perform self care and ADL transfers. Pt and wife educated in uses of 3 in 1, AE for LB ADL, toilet and tub transfers. Pt declined practicing tub transfer this date, plans to sponge bathe initially and will practice with HHPT prior to attempting on his own. No further OT needs.    Follow Up Recommendations  No OT follow up    Equipment Recommendations  3 in 1 bedside comode    Recommendations for Other Services       Precautions / Restrictions Precautions Precautions: Fall Restrictions Weight Bearing Restrictions: Yes LLE Weight Bearing: Weight bearing as tolerated      Mobility Bed Mobility Overal bed mobility: Needs Assistance Bed Mobility: Supine to Sit;Sit to Supine     Supine to sit: Min assist;HOB elevated Sit to supine: Min assist   General bed mobility comments: assist for L LE, increased time, used rail  Transfers Overall transfer level: Needs assistance Equipment used: Rolling walker (2 wheeled) Transfers: Sit to/from Stand Sit to Stand: Min guard;From elevated surface         General transfer comment: cues for hand placement and technique    Balance Overall balance assessment: Needs assistance Sitting-balance support: Feet supported;No upper extremity supported Sitting balance-Leahy Scale: Good    Standing balance support: During functional activity Standing balance-Leahy Scale: Fair                              ADL Overall ADL's : Needs assistance/impaired Eating/Feeding: Set up;Sitting   Grooming: Wash/dry hands;Wash/dry face;Set up;Sitting    Upper Body Bathing: Set up;Sitting   Lower Body Bathing: Minimal assistance;Sit to/from stand Lower Body Bathing Details (indicate cue type and reason): instructed in use of long handled bath sponge Upper Body Dressing : Set up;Sitting   Lower Body Dressing: Minimal assistance;Sit to/from stand Lower Body Dressing Details (indicate cue type and reason): Instructed in use of sock aid, reacher and long shoe horn, instructed to dress L LE first, educated in safe footwear Toilet Transfer: Min guard;Ambulation Toilet Transfer Details (indicate cue type and reason): instructed in use of 3 in 1 over toilet and to straddle walker with RW when standing to urinate Toileting- Clothing Manipulation and Hygiene: Min guard (standing)     Tub/Shower Transfer Details (indicate cue type and reason): educated in technique and use of 3 in 1 as shower seat Functional mobility during ADLs: Min Contractorguard;Rolling walker       Vision     Perception     Praxis      Pertinent Vitals/Pain Pain Assessment: 0-10 Pain Score: 8  Faces Pain Scale: Hurts little more Pain Location: L hip Pain Descriptors / Indicators: Aching Pain Intervention(s): Monitored during session;RN gave pain meds during session;Ice applied;Repositioned     Hand Dominance Right   Extremity/Trunk Assessment Upper Extremity Assessment Upper Extremity Assessment: Overall WFL for tasks assessed   Lower Extremity Assessment Lower Extremity Assessment: Defer to PT evaluation       Communication Communication Communication: No difficulties   Cognition Arousal/Alertness: Awake/alert Behavior During Therapy: [facility]  tasks assessed/performed Overall Cognitive Status: Within Functional Limits for tasks assessed                     General Comments       Exercises      Shoulder Instructions      Home Living Family/patient expects to be discharged to:: Private residence Living Arrangements: Spouse/significant  other Available Help at Discharge: Family;Available PRN/intermittently Type of Home: House Home Access: Stairs to enter [employer] of Steps: 1 Entrance Stairs-Rails: None Home Layout: One level     Bathroom Shower/Tub: Chief Strategy Officer: Standard     Home Equipment: Environmental consultant - 2 wheels;Cane - single point;Bedside commode          Prior Functioning/Environment Level of Independence: Independent                 OT Problem List:     OT Treatment/Interventions:      OT Goals(Current goals can be found in the care plan section) Acute Rehab OT Goals Patient Stated Goal: to get back to mowing my lawn  OT Frequency:     Barriers to D/C:            Co-evaluation              End of Session Equipment Utilized During Treatment: Gait belt;Rolling walker  Activity Tolerance: Patient tolerated treatment well Patient left: in bed;with call bell/phone within reach;with family/visitor present   Time: 4540-9811 OT Time Calculation (min): 23 min Charges:  OT General Charges $OT Visit: 1 Procedure OT Evaluation $OT Eval Low Complexity: 1 Procedure OT Treatments $Self Care/Home Management : 8-22 mins G-Codes:    Evern Bio 03/23/2016, 3:36 PM  [phone]

## 2016-03-23 NOTE — Progress Notes (Deleted)
Pt temp is 100.5- he asked for tylenol because he feels "hot". Will give PRN tylenol

## 2016-03-23 NOTE — Progress Notes (Addendum)
Physical Therapy Treatment Patient Details Name: Randall Dudley MRN: [id number] DOB: 02-26-1956 Today's Date: 03/23/2016    History of Present Illness Patient is a 60 y/o male with hx of HTN and HLD presents s/p left THA, direct anterior approach.    PT Comments    Patient progressing well. Pain improved this afternoon. Instructed pt in exercises and encouraged pt to perform them 3 times/day. Will provide handout tomorrow. Pt continues to have difficulty with bed mobility and bringing LLE into/out of bed and has a high bed at home. Ambulation deferred as pt just returned from walking with nurse and wife. Will continue to follow to maximize independence and mobility prior to return home.  Follow Up Recommendations  Home health PT;Supervision for mobility/OOB     Equipment Recommendations  None recommended by PT    Recommendations for Other Services       Precautions / Restrictions Precautions Precautions: Fall Restrictions Weight Bearing Restrictions: Yes LLE Weight Bearing: Weight bearing as tolerated    Mobility  Bed Mobility Overal bed mobility: Needs Assistance Bed Mobility: Supine to Sit     Supine to sit: Min assist;HOB elevated     General bed mobility comments: Assist to bring LLE to EOB. Difficulty elevating trunk using rail despite cues but able to perform without assist.  Requires assist bringing LLE into bed- instructed to use sheet to assist.   Transfers Overall transfer level: Needs assistance Equipment used: Rolling walker (2 wheeled) Transfers: Sit to/from Stand Sit to Stand: Min assist;Min guard;From elevated surface         General transfer comment: Min A initially to power to standing with cues for hand placement/technique progressing to Min guard assist.  Ambulation/Gait Ambulation/Gait assistance:  (Deferred as pt jsut returned from ambulation with nurses prior to PT arrival.)               Stairs            Wheelchair Mobility     Modified Rankin (Stroke Patients Only)       Balance Overall balance assessment: Needs assistance Sitting-balance support: Feet supported;No upper extremity supported Sitting balance-Leahy Scale: Good Sitting balance - Comments: Cues to equal weight through Bil hips instead of offloading left hip.   Standing balance support: During functional activity Standing balance-Leahy Scale: Fair                      Cognition Arousal/Alertness: Awake/alert Behavior During Therapy: WFL for tasks assessed/performed Overall Cognitive Status: Within Functional Limits for tasks assessed                      Exercises Total Joint Exercises Towel Squeeze: Both;10 reps;Supine Heel Slides: Left;10 reps;Supine Hip ABduction/ADduction: Left;10 reps;AAROM;Supine Long Arc Quad: Left;10 reps;Supine Other Exercises Other Exercises: sit to stand x5 focusing on equal weight through BLEs to stand and with slow descent into chair    General Comments        Pertinent Vitals/Pain Pain Assessment: Faces Faces Pain Scale: Hurts little more Pain Location: left hip with mobility Pain Descriptors / Indicators: Sore;Operative site guarding;Guarding Pain Intervention(s): Monitored during session;Repositioned;Ice applied    Home Living                      Prior Function            PT Goals (current goals can now be found in the care plan section) Progress towards PT goals:  Progressing toward goals    Frequency    7X/week      PT Plan Current plan remains appropriate    Co-evaluation             End of Session Equipment Utilized During Treatment: Gait belt Activity Tolerance: Patient tolerated treatment well Patient left: in bed;with call bell/phone within reach;with family/visitor present     Time: 1420-1450 PT Time Calculation (min) (ACUTE ONLY): 30 min  Charges:  $Therapeutic Exercise: 8-22 mins $Therapeutic Activity: 8-22 mins                     G Codes:      Prisha Hiley A Jyasia Markoff 03/23/2016, 3:04 PM Mylo RedShauna Bence Trapp, PT, DPT [phone]

## 2016-03-23 NOTE — Progress Notes (Signed)
Subjective: 1 Day Post-Op Procedure(s) (LRB): TOTAL HIP ARTHROPLASTY ANTERIOR APPROACH (Left)  Activity level:  wbat Diet tolerance:  ok Voiding:  ok Patient reports pain as mild and moderate.    Objective: Vital signs in last 24 hours: Temp:  [97.6 F (36.4 C)-100.4 F (38 C)] 99 F (37.2 C) (09/20 0618) Pulse Rate:  [60-88] 80 (09/20 0618) Resp:  [14-20] 17 (09/20 0618) BP: (89-128)/(50-86) 118/69 (09/20 0618) SpO2:  [91 %-100 %] 92 % (09/20 0618)  Labs: No results for input(s): HGB in the last 72 hours. No results for input(s): WBC, RBC, HCT, PLT in the last 72 hours. No results for input(s): NA, K, CL, CO2, BUN, CREATININE, GLUCOSE, CALCIUM in the last 72 hours. No results for input(s): LABPT, INR in the last 72 hours.  Physical Exam:  Neurologically intact ABD soft Neurovascular intact Sensation intact distally Intact pulses distally Dorsiflexion/Plantar flexion intact Incision: dressing C/D/I and no drainage No cellulitis present  Assessment/Plan:  1 Day Post-Op Procedure(s) (LRB): TOTAL HIP ARTHROPLASTY ANTERIOR APPROACH (Left) Advance diet Up with therapy D/C IV fluids Plan for discharge tomorrow Discharge home with home health if doing well and cleared by PT. Follow up in office 2 weeks post op. Continue on ASA 325mg  BID x 4 weeks post op. Continue current pain meds.   Sidhant Helderman, Ginger OrganNDREW PAUL 03/23/2016, 7:46 AM

## 2016-03-24 LAB — TYPE AND SCREEN
ABO/RH(D): O POS
Antibody Screen: NEGATIVE
Unit division: 0
Unit division: 0

## 2016-03-24 MED ORDER — ASPIRIN 325 MG PO TBEC: 325.0000 mg | DELAYED_RELEASE_TABLET | Freq: Two times a day (BID) | ORAL | 0 refills | Status: AC

## 2016-03-24 MED ORDER — OXYCODONE-ACETAMINOPHEN 5-325 MG PO TABS: 1.0000 | ORAL_TABLET | ORAL | 0 refills | Status: AC | PRN

## 2016-03-24 MED ORDER — METHOCARBAMOL 500 MG PO TABS: 500.0000 mg | ORAL_TABLET | Freq: Four times a day (QID) | ORAL | 0 refills | Status: AC | PRN

## 2016-03-24 NOTE — Discharge Summary (Signed)
Patient ID: Randall Dudley MRN: [id number] DOB/AGE: 1955-12-02 60 y.o.  Admit date: 03/22/2016 Discharge date: 03/24/2016  Admission Diagnoses:  Principal Problem:   Primary osteoarthritis of left hip   Discharge Diagnoses:  Same  Past Medical History:  Diagnosis Date  . Arthritis   . DJD (degenerative joint disease)   . Hyperlipemia   . Hypertension   . Shortness of breath dyspnea    with exertion    Surgeries: Procedure(s): TOTAL HIP ARTHROPLASTY ANTERIOR APPROACH on 03/22/2016   Consultants:   Discharged Condition: Improved  Hospital Course: Randall Dudley is an 60 y.o. male who was admitted 03/22/2016 for operative treatment ofPrimary osteoarthritis of left hip. Patient has severe unremitting pain that affects sleep, daily activities, and work/hobbies. After pre-op clearance the patient was taken to the operating room on 03/22/2016 and underwent  Procedure(s): TOTAL HIP ARTHROPLASTY ANTERIOR APPROACH.    Patient was given perioperative antibiotics: Anti-infectives    Start     Dose/Rate Route Frequency Ordered Stop   03/22/16 1600  ceFAZolin (ANCEF) IVPB 2g/100 mL premix     2 g 200 mL/hr over 30 Minutes Intravenous Every 6 hours 03/22/16 1514 03/23/16 0003   03/22/16 0616  ceFAZolin (ANCEF) IVPB 2g/100 mL premix     2 g 200 mL/hr over 30 Minutes Intravenous On call to O.R. 03/22/16 4098 03/22/16 0750       Patient was given sequential compression devices, early ambulation, and chemoprophylaxis to prevent DVT.  Patient benefited maximally from hospital stay and there were no complications.    Recent vital signs: Patient Vitals for the past 24 hrs:  BP Temp Temp src Pulse Resp SpO2  03/24/16 0500 120/66 98.9 F (37.2 C) Oral 88 18 94 %  03/24/16 0402 - 98.6 F (37 C) Oral - - -  03/23/16 2048 113/67 (!) 100.5 F (38.1 C) Oral 85 16 92 %  03/23/16 1500 123/82 99.5 F (37.5 C) Oral 92 17 100 %     Recent laboratory studies: No results for input(s): WBC, HGB,  HCT, PLT, NA, K, CL, CO2, BUN, CREATININE, GLUCOSE, INR, CALCIUM in the last 72 hours.  Invalid input(s): PT, 2   Discharge Medications:     Medication List    STOP taking these medications   ibuprofen 200 MG tablet Commonly known as:  ADVIL,MOTRIN     TAKE these medications   aspirin 325 MG EC tablet Take 1 tablet (325 mg total) by mouth 2 (two) times daily after a meal.   lovastatin 20 MG tablet Commonly known as:  MEVACOR Take 20 mg by mouth at bedtime.   methocarbamol 500 MG tablet Commonly known as:  ROBAXIN Take 1 tablet (500 mg total) by mouth every 6 (six) hours as needed for muscle spasms.   metoprolol tartrate 25 MG tablet Commonly known as:  LOPRESSOR Take 25 mg by mouth 2 (two) times daily.   oxyCODONE-acetaminophen 5-325 MG tablet Commonly known as:  PERCOCET/ROXICET Take 1-2 tablets by mouth every 4 (four) hours as needed for severe pain. What changed:  how much to take  when to take this  reasons to take this       Diagnostic Studies: Dg C-arm 61-120 Min  Result Date: 03/22/2016 CLINICAL DATA:  Status post total hip replacement EXAM: DG C-ARM 61-120 MIN; OPERATIVE LEFT HIP COMPARISON:  None. FLUOROSCOPY TIME:  0 minutes 24 seconds; 2 acquired images FINDINGS: Frontal view shows a total hip replacement on the left with prosthetic components appearing well-seated.  No fracture or dislocation evident on this single view. IMPRESSION: Status post total hip replacement on the left with prosthetic components appearing well-seated on the frontal view. No fracture or dislocation evident. Electronically Signed   By: Bretta BangWilliam  Woodruff III M.D.   On: 03/22/2016 09:12   Dg Hip Operative Unilat With Pelvis Left  Result Date: 03/22/2016 CLINICAL DATA:  Status post total hip replacement EXAM: DG C-ARM 61-120 MIN; OPERATIVE LEFT HIP COMPARISON:  None. FLUOROSCOPY TIME:  0 minutes 24 seconds; 2 acquired images FINDINGS: Frontal view shows a total hip replacement on the  left with prosthetic components appearing well-seated. No fracture or dislocation evident on this single view. IMPRESSION: Status post total hip replacement on the left with prosthetic components appearing well-seated on the frontal view. No fracture or dislocation evident. Electronically Signed   By: Bretta BangWilliam  Woodruff III M.D.   On: 03/22/2016 09:12    Disposition: Final discharge disposition not confirmed  Discharge Instructions    Call MD / Call 911    Complete by:  As directed    If you experience chest pain or shortness of breath, CALL 911 and be transported to the hospital emergency room.  If you develope a fever above 101 F, pus (white drainage) or increased drainage or redness at the wound, or calf pain, call your surgeon's office.   Constipation Prevention    Complete by:  As directed    Drink plenty of fluids.  Prune juice may be helpful.  You may use a stool softener, such as Colace (over the counter) 100 mg twice a day.  Use MiraLax (over the counter) for constipation as needed.   Diet - low sodium heart healthy    Complete by:  As directed    Discharge instructions    Complete by:  As directed    INSTRUCTIONS AFTER JOINT REPLACEMENT   Remove items at home which could result in a fall. This includes throw rugs or furniture in walking pathways ICE to the affected joint every three hours while awake for 30 minutes at a time, for at least the first 3-5 days, and then as needed for pain and swelling.  Continue to use ice for pain and swelling. You may notice swelling that will progress down to the foot and ankle.  This is normal after surgery.  Elevate your leg when you are not up walking on it.   Continue to use the breathing machine you got in the hospital (incentive spirometer) which will help keep your temperature down.  It is common for your temperature to cycle up and down following surgery, especially at night when you are not up moving around and exerting yourself.  The breathing  machine keeps your lungs expanded and your temperature down.   DIET:  As you were doing prior to hospitalization, we recommend a well-balanced diet.  DRESSING / WOUND CARE / SHOWERING  You may shower 3 days after surgery, but keep the wounds dry during showering.  You may use an occlusive plastic wrap (Press'n Seal for example), NO SOAKING/SUBMERGING IN THE BATHTUB.  If the bandage gets wet, change with a clean dry gauze.  If the incision gets wet, pat the wound dry with a clean towel.  ACTIVITY  Increase activity slowly as tolerated, but follow the weight bearing instructions below.   No driving for 6 weeks or until further direction given by your physician.  You cannot drive while taking narcotics.  No lifting or carrying greater than 10 lbs.  until further directed by your surgeon. Avoid periods of inactivity such as sitting longer than an hour when not asleep. This helps prevent blood clots.  You may return to work once you are authorized by your doctor.     WEIGHT BEARING   Weight bearing as tolerated with assist device (walker, cane, etc) as directed, use it as long as suggested by your surgeon or therapist, typically at least 4-6 weeks.   EXERCISES  Results after joint replacement surgery are often greatly improved when you follow the exercise, range of motion and muscle strengthening exercises prescribed by your doctor. Safety measures are also important to protect the joint from further injury. Any time any of these exercises cause you to have increased pain or swelling, decrease what you are doing until you are comfortable again and then slowly increase them. If you have problems or questions, call your caregiver or physical therapist for advice.   Rehabilitation is important following a joint replacement. After just a few days of immobilization, the muscles of the leg can become weakened and shrink (atrophy).  These exercises are designed to build up the tone and strength of the  thigh and leg muscles and to improve motion. Often times heat used for twenty to thirty minutes before working out will loosen up your tissues and help with improving the range of motion but do not use heat for the first two weeks following surgery (sometimes heat can increase post-operative swelling).   These exercises can be done on a training (exercise) mat, on the floor, on a table or on a bed. Use whatever works the best and is most comfortable for you.    Use music or television while you are exercising so that the exercises are a pleasant break in your day. This will make your life better with the exercises acting as a break in your routine that you can look forward to.   Perform all exercises about fifteen times, three times per day or as directed.  You should exercise both the operative leg and the other leg as well.   Exercises include:   Quad Sets - Tighten up the muscle on the front of the thigh (Quad) and hold for 5-10 seconds.   Straight Leg Raises - With your knee straight (if you were given a brace, keep it on), lift the leg to 60 degrees, hold for 3 seconds, and slowly lower the leg.  Perform this exercise against resistance later as your leg gets stronger.  Leg Slides: Lying on your back, slowly slide your foot toward your buttocks, bending your knee up off the floor (only go as far as is comfortable). Then slowly slide your foot back down until your leg is flat on the floor again.  Angel Wings: Lying on your back spread your legs to the side as far apart as you can without causing discomfort.  Hamstring Strength:  Lying on your back, push your heel against the floor with your leg straight by tightening up the muscles of your buttocks.  Repeat, but this time bend your knee to a comfortable angle, and push your heel against the floor.  You may put a pillow under the heel to make it more comfortable if necessary.   A rehabilitation program following joint replacement surgery can speed  recovery and prevent re-injury in the future due to weakened muscles. Contact your doctor or a physical therapist for more information on knee rehabilitation.    CONSTIPATION  Constipation is defined medically  as fewer than three stools per week and severe constipation as less than one stool per week.  Even if you have a regular bowel pattern at home, your normal regimen is likely to be disrupted due to multiple reasons following surgery.  Combination of anesthesia, postoperative narcotics, change in appetite and fluid intake all can affect your bowels.   YOU MUST use at least one of the following options; they are listed in order of increasing strength to get the job done.  They are all available over the counter, and you may need to use some, POSSIBLY even all of these options:    Drink plenty of fluids (prune juice may be helpful) and high fiber foods Colace 100 mg by mouth twice a day  Senokot for constipation as directed and as needed Dulcolax (bisacodyl), take with full glass of water  Miralax (polyethylene glycol) once or twice a day as needed.  If you have tried all these things and are unable to have a bowel movement in the first 3-4 days after surgery call either your surgeon or your primary doctor.    If you experience loose stools or diarrhea, hold the medications until you stool forms back up.  If your symptoms do not get better within 1 week or if they get worse, check with your doctor.  If you experience "the worst abdominal pain ever" or develop nausea or vomiting, please contact the office immediately for further recommendations for treatment.   ITCHING:  If you experience itching with your medications, try taking only a single pain pill, or even half a pain pill at a time.  You can also use Benadryl over the counter for itching or also to help with sleep.   TED HOSE STOCKINGS:  Use stockings on both legs until for at least 2 weeks or as directed by physician office. They may be  removed at night for sleeping.  MEDICATIONS:  See your medication summary on the "After Visit Summary" that nursing will review with you.  You may have some home medications which will be placed on hold until you complete the course of blood thinner medication.  It is important for you to complete the blood thinner medication as prescribed.  PRECAUTIONS:  If you experience chest pain or shortness of breath - call 911 immediately for transfer to the hospital emergency department.   If you develop a fever greater that 101 F, purulent drainage from wound, increased redness or drainage from wound, foul odor from the wound/dressing, or calf pain - CONTACT YOUR SURGEON.                                                   FOLLOW-UP APPOINTMENTS:  If you do not already have a post-op appointment, please call the office for an appointment to be seen by your surgeon.  Guidelines for how soon to be seen are listed in your "After Visit Summary", but are typically between 1-4 weeks after surgery.  OTHER INSTRUCTIONS:   Knee Replacement:  Do not place pillow under knee, focus on keeping the knee straight while resting. CPM instructions: 0-90 degrees, 2 hours in the morning, 2 hours in the afternoon, and 2 hours in the evening. Place foam block, curve side up under heel at all times except when in CPM or when walking.  DO NOT modify, tear,  cut, or change the foam block in any way.  MAKE SURE YOU:  Understand these instructions.  Get help right away if you are not doing well or get worse.    Thank you for letting us be a part of your medical care team.  It is a privilege we respect greatly.  We hope these instructions will help you stay on track for a fast and full recovery!   Increase activity slowly as tolerated    Complete by:  As directed       Follow-up Information    DALLDORF,PETER G, MD. Schedule an appointment as soon as possible for a visit in 2 weeks.   Specialty:  Orthopedic Surgery Contact  information: [address] [place] [address] [phone]            Signed: Drema Halon 03/24/2016, 1:50 PM

## 2016-03-24 NOTE — Progress Notes (Signed)
Patient discharged to home, discharge instructions given, patient stated he understood 

## 2016-03-24 NOTE — Progress Notes (Signed)
Physical Therapy Treatment Patient Details Name: Randall Dudley MRN: [id number] DOB: 10/10/1955 Today's Date: 03/24/2016    History of Present Illness Patient is a 60 y/o male with hx of HTN and HLD presents s/p left THA, direct anterior approach.    PT Comments    Pt making gradual progress with PT, able to ambulate 60 feet with rw but with slow pattern and poor ability to bear weight through the LLE. Pt improving during the course of the session but continuing to need cues throughout. Pt confirming that he will have his spouse and daughters at home to assist him as needed.   Follow Up Recommendations  Home health PT;Supervision for mobility/OOB     Equipment Recommendations  None recommended by PT    Recommendations for Other Services       Precautions / Restrictions Precautions Precautions: Fall Restrictions Weight Bearing Restrictions: Yes LLE Weight Bearing: Weight bearing as tolerated    Mobility  Bed Mobility Overal bed mobility: Needs Assistance Bed Mobility: Supine to Sit     Supine to sit: Mod assist     General bed mobility comments: cues for pt to use rail, assist needed with trunk, no assist needed with LLE.  Transfers Overall transfer level: Needs assistance Equipment used: Rolling walker (2 wheeled) Transfers: Sit to/from Stand Sit to Stand: Min guard         General transfer comment: cues for hand placement  Ambulation/Gait Ambulation/Gait assistance: Min guard Ambulation Distance (Feet): 60 Feet Assistive device: Rolling walker (2 wheeled) Gait Pattern/deviations: Step-to pattern;Decreased weight shift to left Gait velocity: slow pattern   General Gait Details: cues for weightbearing through LLE, encouraging increased stride with Rt LE.   Stairs            Wheelchair Mobility    Modified Rankin (Stroke Patients Only)       Balance Overall balance assessment: Needs assistance Sitting-balance support: No upper extremity  supported Sitting balance-Leahy Scale: Good     Standing balance support: Bilateral upper extremity supported Standing balance-Leahy Scale: Poor Standing balance comment: using rw                     Cognition Arousal/Alertness: Awake/alert Behavior During Therapy: WFL for tasks assessed/performed Overall Cognitive Status: Within Functional Limits for tasks assessed                      Exercises Total Joint Exercises Ankle Circles/Pumps: Both;10 reps;Supine Quad Sets: Both;10 reps;Supine Heel Slides: AAROM;Left;10 reps Hip ABduction/ADduction: Strengthening;Left;10 reps    General Comments General comments (skin integrity, edema, etc.): Pt confirms that he has help from his wife and daughters at home.       Pertinent Vitals/Pain Pain Assessment: 0-10 Pain Score: 7  Pain Location: Lt hip/thigh Pain Descriptors / Indicators: Sore;Aching Pain Intervention(s): Limited activity within patient's tolerance;Monitored during session    Home Living                      Prior Function            PT Goals (current goals can now be found in the care plan section) Acute Rehab PT Goals Patient Stated Goal: be active PT Goal Formulation: With patient/family Time For Goal Achievement: 04/06/16 Potential to Achieve Goals: Good Progress towards PT goals: Progressing toward goals    Frequency    7X/week      PT Plan Current plan remains appropriate  Co-evaluation             End of Session Equipment Utilized During Treatment: Gait belt Activity Tolerance: Patient tolerated treatment well Patient left: in chair;with call bell/phone within reach;with nursing/sitter in room     Time: 0951-1018 PT Time Calculation (min) (ACUTE ONLY): 27 min  Charges:  $Gait Training: 8-22 mins $Therapeutic Exercise: 8-22 mins                    G Codes:      Christiane Ha, PT, CSCS Pager [phone] Office [phone]  03/24/2016, 10:27  AM

## 2016-03-24 NOTE — Progress Notes (Signed)
Physical Therapy Treatment Patient Details Name: Randall Dudley MRN: [id number] DOB: 04/29/1956 Today's Date: 03/24/2016    History of Present Illness Patient is a 60 y/o male with hx of HTN and HLD presents s/p left THA, direct anterior approach.    PT Comments    Patient is making good progress with PT.  From a mobility standpoint anticipate patient will be ready for DC home with family support. Pt able to ambulate 70 feet and go up/down 1 step. Pt confirms that he will have assistance at home. Patient denies any questions or concerns.     Follow Up Recommendations  Home health PT;Supervision for mobility/OOB     Equipment Recommendations  None recommended by PT    Recommendations for Other Services       Precautions / Restrictions Precautions Precautions: Fall Restrictions Weight Bearing Restrictions: Yes LLE Weight Bearing: Weight bearing as tolerated    Mobility  Bed Mobility               General bed mobility comments: pt up in room upon arrival  Transfers Overall transfer level: Needs assistance Equipment used: Rolling walker (2 wheeled) Transfers: Sit to/from Stand Sit to Stand: Supervision         General transfer comment: cues for reaching back for chair when sitting.   Ambulation/Gait Ambulation/Gait assistance: Min guard Ambulation Distance (Feet): 70 Feet Assistive device: Rolling walker (2 wheeled) Gait Pattern/deviations: Step-to pattern Gait velocity: slow pattern   General Gait Details: Cues for increasing weightbearing through LLE with stand phase, pt with increased difficulty with Rt foot clearance with fatigue. Cues also provided for pt to push rw and not pickup/set down.    Stairs Stairs: Yes Stairs assistance: Min guard Stair Management: No rails;Backwards;With walker Number of Stairs: 1 General stair comments: Pt demonstrates good safety, denies questions or concerns.   Wheelchair Mobility    Modified Rankin (Stroke  Patients Only)       Balance Overall balance assessment: Needs assistance Sitting-balance support: No upper extremity supported Sitting balance-Leahy Scale: Good     Standing balance support: Bilateral upper extremity supported Standing balance-Leahy Scale: Poor Standing balance comment: using rw                    Cognition Arousal/Alertness: Awake/alert Behavior During Therapy: WFL for tasks assessed/performed Overall Cognitive Status: Within Functional Limits for tasks assessed                      Exercises      General Comments General comments (skin integrity, edema, etc.): pt declines reviewing HEP, reports feeling confident.       Pertinent Vitals/Pain Pain Assessment: 0-10 Pain Score: 6  Pain Location: Lt hip/thigh Pain Descriptors / Indicators: Aching;Sore Pain Intervention(s): Limited activity within patient's tolerance;Monitored during session    Home Living                      Prior Function            PT Goals (current goals can now be found in the care plan section) Acute Rehab PT Goals Patient Stated Goal: less pain and move better PT Goal Formulation: With patient/family Time For Goal Achievement: 04/06/16 Potential to Achieve Goals: Good Progress towards PT goals: Progressing toward goals    Frequency    7X/week      PT Plan Current plan remains appropriate    Co-evaluation  End of Session Equipment Utilized During Treatment: Gait belt Activity Tolerance: Patient tolerated treatment well Patient left: in chair;with call bell/phone within reach;with nursing/sitter in room     Time: 1439-1450 PT Time Calculation (min) (ACUTE ONLY): 11 min  Charges:  $Gait Training: 8-22 mins                    G Codes:      Christiane Ha, PT, CSCS Pager [phone] Office [phone]  03/24/2016, 3:00 PM

## 2018-02-17 IMAGING — RF DG HIP (WITH PELVIS) OPERATIVE*L*
1 series · 2 of 2 positions shown · non-contrast
Comparison: None.

FLUOROSCOPY TIME:  0 minutes 24 seconds; 2 acquired images

CLINICAL DATA: Status post total hip replacement

EXAM:
DG C-ARM 61-120 MIN; OPERATIVE LEFT HIP

[Series 1: run · 2 of 2 slices shown]
[im 1/2]
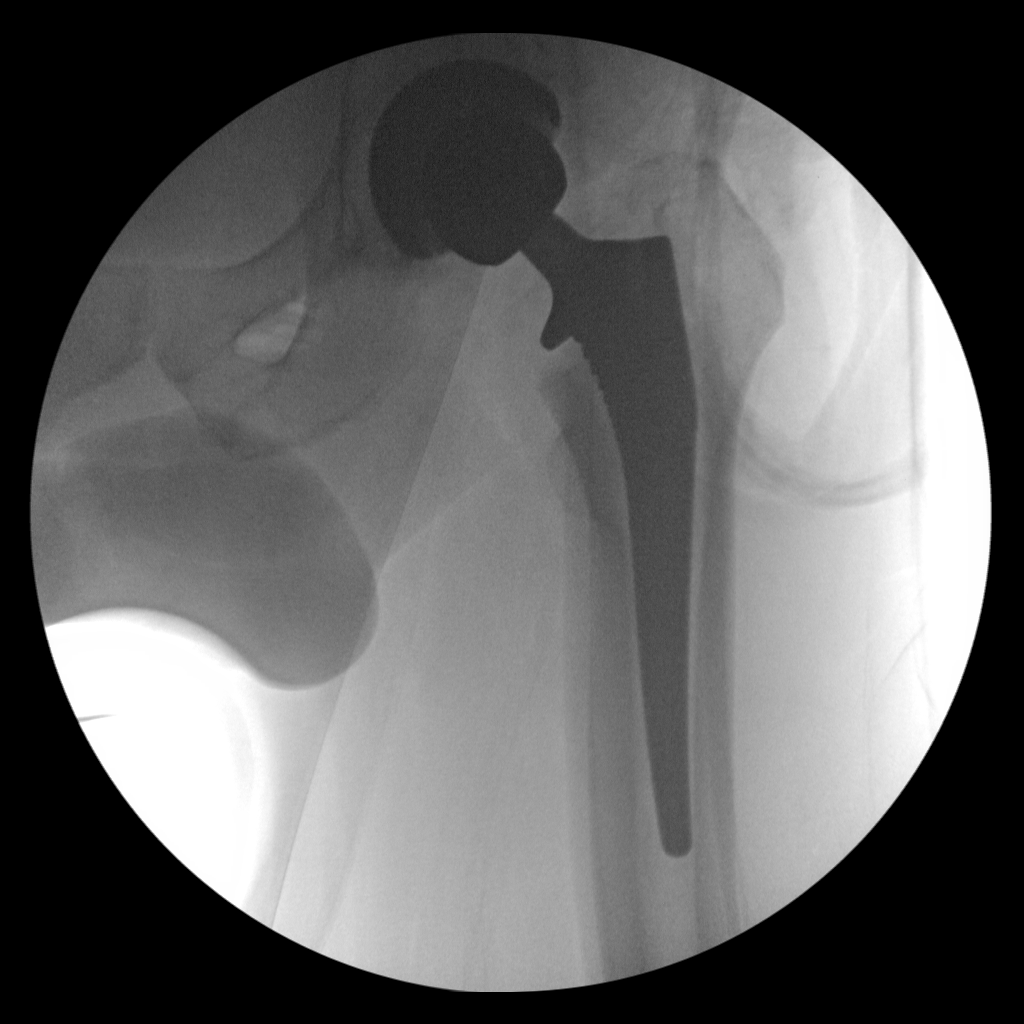
[im 2/2]
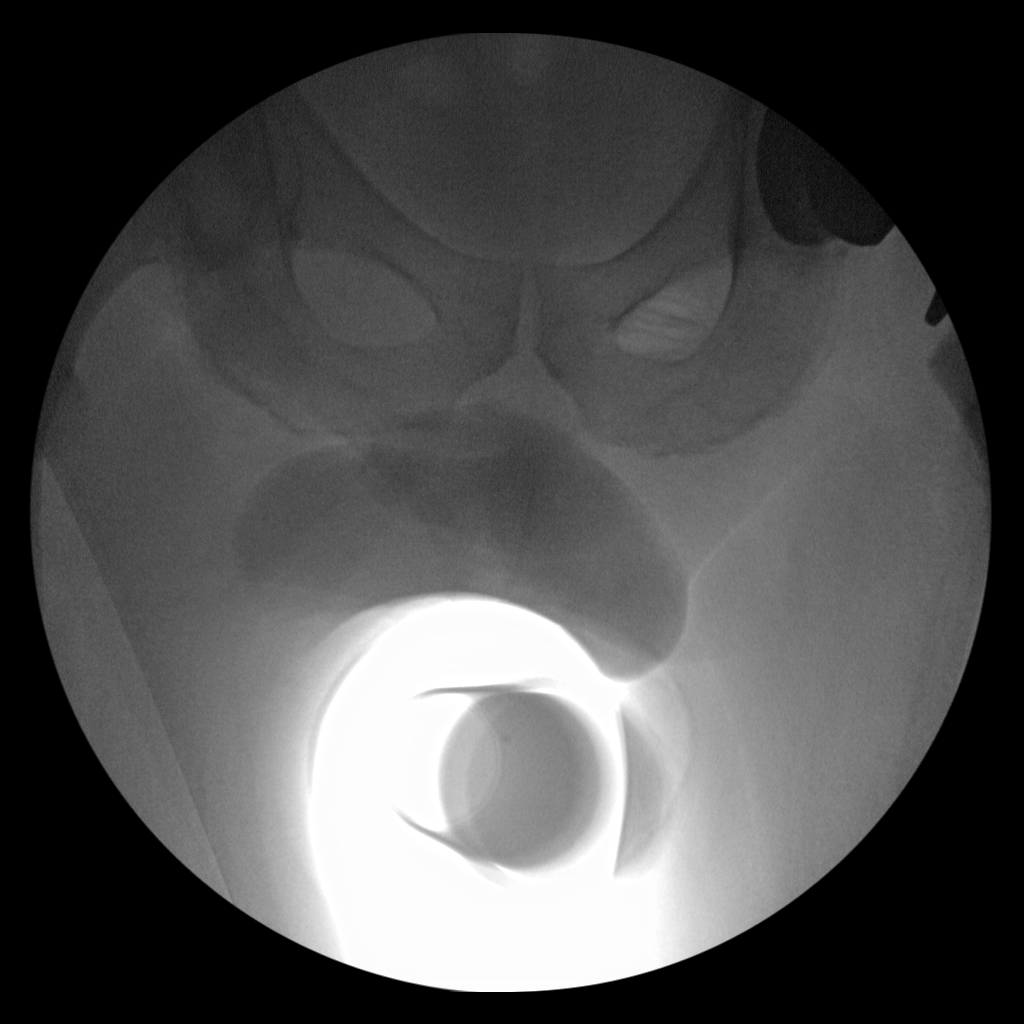

[2 of 2 positions shown; findings below may reference images not displayed]

FINDINGS: Frontal view shows a total hip replacement on the left with
prosthetic components appearing well-seated. No fracture or
dislocation evident on this single view.
IMPRESSION: Status post total hip replacement on the left with prosthetic
components appearing well-seated on the frontal view. No fracture or
dislocation evident.
